# Patient Record
Sex: Female | Born: 1965 | Race: White | Hispanic: No | Marital: Single | Smoking: Never smoker
Health system: Southern US, Community
[De-identification: ages and names within clinical notes are randomized; demographics above are authoritative.]

## PROBLEM LIST (undated history)

## (undated) DIAGNOSIS — D496 Neoplasm of unspecified behavior of brain: Secondary | ICD-10-CM

---

## 1998-02-04 ENCOUNTER — Inpatient Hospital Stay (HOSPITAL_COMMUNITY): Admission: EM | Admit: 1998-02-04 | Discharge: 1998-02-06 | Payer: Self-pay | Admitting: Emergency Medicine

## 1998-06-12 ENCOUNTER — Ambulatory Visit (HOSPITAL_COMMUNITY): Admission: RE | Admit: 1998-06-12 | Discharge: 1998-06-12 | Payer: Self-pay

## 1998-06-18 ENCOUNTER — Emergency Department (HOSPITAL_COMMUNITY): Admission: EM | Admit: 1998-06-18 | Discharge: 1998-06-18 | Payer: Self-pay | Admitting: Emergency Medicine

## 1998-06-18 ENCOUNTER — Encounter: Payer: Self-pay | Admitting: Emergency Medicine

## 1998-07-04 ENCOUNTER — Ambulatory Visit (HOSPITAL_COMMUNITY): Admission: RE | Admit: 1998-07-04 | Discharge: 1998-07-04 | Payer: Self-pay

## 1998-10-23 ENCOUNTER — Encounter: Payer: Self-pay | Admitting: Emergency Medicine

## 1998-10-23 ENCOUNTER — Emergency Department (HOSPITAL_COMMUNITY): Admission: EM | Admit: 1998-10-23 | Discharge: 1998-10-24 | Payer: Self-pay | Admitting: Emergency Medicine

## 1998-11-08 ENCOUNTER — Encounter: Payer: Self-pay | Admitting: Internal Medicine

## 1998-11-08 ENCOUNTER — Inpatient Hospital Stay (HOSPITAL_COMMUNITY): Admission: EM | Admit: 1998-11-08 | Discharge: 1998-11-11 | Payer: Self-pay | Admitting: Internal Medicine

## 1999-06-14 ENCOUNTER — Encounter: Payer: Self-pay | Admitting: Emergency Medicine

## 1999-06-14 ENCOUNTER — Emergency Department (HOSPITAL_COMMUNITY): Admission: EM | Admit: 1999-06-14 | Discharge: 1999-06-14 | Payer: Self-pay | Admitting: Emergency Medicine

## 1999-07-08 ENCOUNTER — Encounter: Payer: Self-pay | Admitting: Orthopedic Surgery

## 1999-07-09 ENCOUNTER — Observation Stay (HOSPITAL_COMMUNITY): Admission: RE | Admit: 1999-07-09 | Discharge: 1999-07-10 | Payer: Self-pay | Admitting: Orthopedic Surgery

## 1999-07-09 ENCOUNTER — Encounter: Payer: Self-pay | Admitting: Orthopedic Surgery

## 1999-11-13 ENCOUNTER — Ambulatory Visit (HOSPITAL_COMMUNITY): Admission: RE | Admit: 1999-11-13 | Discharge: 1999-11-13 | Payer: Self-pay | Admitting: Orthopedic Surgery

## 2005-09-17 ENCOUNTER — Ambulatory Visit: Payer: Self-pay

## 2009-09-07 ENCOUNTER — Ambulatory Visit: Payer: Self-pay

## 2009-11-26 ENCOUNTER — Emergency Department: Payer: Self-pay | Admitting: Emergency Medicine

## 2010-01-15 ENCOUNTER — Ambulatory Visit: Payer: Self-pay

## 2010-02-05 ENCOUNTER — Ambulatory Visit: Payer: Self-pay

## 2010-02-13 ENCOUNTER — Ambulatory Visit: Payer: Self-pay

## 2010-08-08 ENCOUNTER — Ambulatory Visit: Payer: Self-pay

## 2011-02-04 ENCOUNTER — Ambulatory Visit: Payer: Self-pay

## 2018-11-30 ENCOUNTER — Emergency Department
Admission: EM | Admit: 2018-11-30 | Discharge: 2018-12-01 | Disposition: A | Discharge status: Other Acute Inpatient Hospital | Payer: Medicaid Other | Attending: Emergency Medicine | Admitting: Emergency Medicine

## 2018-11-30 DIAGNOSIS — R569 Unspecified convulsions: Secondary | ICD-10-CM | POA: Diagnosis not present

## 2018-11-30 DIAGNOSIS — R531 Weakness: Secondary | ICD-10-CM | POA: Diagnosis present

## 2018-11-30 DIAGNOSIS — D330 Benign neoplasm of brain, supratentorial: Secondary | ICD-10-CM | POA: Diagnosis not present

## 2018-11-30 HISTORY — DX: Neoplasm of unspecified behavior of brain: D49.6

## 2018-11-30 NOTE — ED Triage Notes (Signed)
Pt in via EMS from home d/t L leg numbness; BG 140 ; VSS; BP 149/83; 97% RA; was sitting at home for hours and when decided to get up couldn't; temp 98.6; HR 63 per EMS. Per brother, pt was AMS at first. With EMS, pt A&Ox4.

## 2018-12-01 ENCOUNTER — Other Ambulatory Visit: Payer: Self-pay

## 2018-12-01 ENCOUNTER — Emergency Department: Payer: Medicaid Other

## 2018-12-01 LAB — CBC
HCT: 44.9 % (ref 36.0–46.0)
Hemoglobin: 15.3 g/dL — ABNORMAL HIGH (ref 12.0–15.0)
MCH: 31.9 pg (ref 26.0–34.0)
MCHC: 34.1 g/dL (ref 30.0–36.0)
MCV: 93.5 fL (ref 80.0–100.0)
Platelets: 232 10*3/uL (ref 150–400)
RBC: 4.8 MIL/uL (ref 3.87–5.11)
RDW: 13.4 % (ref 11.5–15.5)
WBC: 7.1 10*3/uL (ref 4.0–10.5)
nRBC: 0 % (ref 0.0–0.2)

## 2018-12-01 LAB — COMPREHENSIVE METABOLIC PANEL
ALT: 27 U/L (ref 0–44)
AST: 21 U/L (ref 15–41)
Albumin: 4 g/dL (ref 3.5–5.0)
Alkaline Phosphatase: 58 U/L (ref 38–126)
Anion gap: 9 (ref 5–15)
BUN: 22 mg/dL — ABNORMAL HIGH (ref 6–20)
CO2: 21 mmol/L — ABNORMAL LOW (ref 22–32)
Calcium: 9.3 mg/dL (ref 8.9–10.3)
Chloride: 111 mmol/L (ref 98–111)
Creatinine, Ser: 0.98 mg/dL (ref 0.44–1.00)
GFR calc Af Amer: >60 mL/min (ref >60)
GFR calc non Af Amer: >60 mL/min (ref >60)
Glucose, Bld: 132 mg/dL — ABNORMAL HIGH (ref 70–99)
Potassium: 3.4 mmol/L — ABNORMAL LOW (ref 3.5–5.1)
Sodium: 141 mmol/L (ref 135–145)
Total Bilirubin: 0.8 mg/dL (ref 0.3–1.2)
Total Protein: 8.4 g/dL — ABNORMAL HIGH (ref 6.5–8.1)

## 2018-12-01 LAB — URINALYSIS, COMPLETE (UACMP) WITH MICROSCOPIC
Bilirubin Urine: NEGATIVE
Glucose, UA: NEGATIVE mg/dL
Hgb urine dipstick: NEGATIVE
Ketones, ur: NEGATIVE mg/dL
Leukocytes,Ua: NEGATIVE
Nitrite: NEGATIVE
Protein, ur: NEGATIVE mg/dL
Specific Gravity, Urine: 1.039 — ABNORMAL HIGH (ref 1.005–1.030)
pH: 5 (ref 5.0–8.0)

## 2018-12-01 LAB — GLUCOSE, CAPILLARY: Glucose-Capillary: 128 mg/dL — ABNORMAL HIGH (ref 70–99)

## 2018-12-01 MED ORDER — GADOBUTROL 1 MMOL/ML IV SOLN
10.0000 mL | Freq: Once | INTRAVENOUS | Status: AC | PRN
  Administered 2018-12-01: 10 mL via INTRAVENOUS

## 2018-12-01 MED ORDER — LEVETIRACETAM IN NACL 1000 MG/100ML IV SOLN
1000.0000 mg | Freq: Once | INTRAVENOUS | Status: AC
  Administered 2018-12-01: 1000 mg via INTRAVENOUS
  Filled 2018-12-01: qty 100

## 2018-12-01 MED ORDER — DEXAMETHASONE SODIUM PHOSPHATE 10 MG/ML IJ SOLN
10.0000 mg | Freq: Once | INTRAMUSCULAR | Status: AC
  Administered 2018-12-01: 07:00:00 10 mg via INTRAVENOUS
  Filled 2018-12-01: qty 1

## 2018-12-01 NOTE — ED Notes (Addendum)
Pt assisted to bedside toilet and then back to bed. Urinated. Repositioned in bed. Denies any other needs. Lights dimmed.

## 2018-12-01 NOTE — ED Notes (Signed)
In room with Dr Owens Shark. Pt understands transfer to [facility] is the best option and agrees. Pt up and ambulated in room with Dr Owens Shark at bedside

## 2018-12-01 NOTE — ED Notes (Signed)
EKG completed. EDP at bedside completing [facility].

## 2018-12-01 NOTE — ED Notes (Signed)
Pt standing at bedside. Difficult for her to follow directions to get back in bed. Unsteady when turning

## 2018-12-01 NOTE — ED Notes (Signed)
Pt's brother Ronalee Belts called wanting update on pt. Pt stated she wanted to speak with brother. Pt given the phone.

## 2018-12-01 NOTE — ED Notes (Signed)
Pt assisted to bedside toilet as she refused bedpan and stated she needed to use the restroom urgently. Pt was tapping both legs against bed. Bed moved next to toilet and pt assisted safely to/from. Urine sample collected.

## 2018-12-01 NOTE — ED Notes (Signed)
Patient transported to MRI 

## 2018-12-01 NOTE — ED Notes (Signed)
Brother Ronalee Belts [phone] will pick up pt when need. States it will take him about 43min. He is aware she is going to MRI and it will likely be a couple hours

## 2018-12-01 NOTE — ED Provider Notes (Signed)
[facility] Emergency Department Provider Note    First MD Initiated Contact with Patient 12/01/18 0000     (approximate)  I have reviewed the triage vital signs and the nursing notes.   HISTORY  Chief Complaint Weakness   HPI Kimberly Stanton is a 53 y.o. 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  Per EMS the patient was sitting on her porch for approximately 4-1/2 hours and was unable to stand.  EMS states that the patient's brother states that this is unusual for the patient.  During evaluation the patient had episodes where she appeared to be non-cognizant to the environment or the question in a manner that was possibly consistent with an absence seizure/partial complex seizure.        Past Medical History:  Diagnosis Date   Brain tumor [facility])    surgically removed per pt    There are no active problems to display for this patient.   History reviewed. No pertinent surgical history.  Prior to Admission medications   Not on File    Allergies Patient has no allergy information on record.  History reviewed. No pertinent family history.  Social History Social History   Tobacco Use   Smoking status: Never Smoker   Smokeless tobacco: Never Used  Substance Use Topics   Alcohol use: Not Currently   Drug use: Yes    Types: Marijuana    Review of Systems Constitutional: No fever/chills Eyes: No visual changes. ENT: No sore throat. Cardiovascular: Denies chest pain. Respiratory: Denies shortness of breath. Gastrointestinal: No abdominal pain.  No nausea, no vomiting.  No diarrhea.  No constipation. Genitourinary: Negative for dysuria. Musculoskeletal: Negative for neck pain.  Negative for back pain. Integumentary: Negative for rash. Neurological: Negative  for headaches, focal weakness or numbness.  Positive for lower extremity weakness.  ____________________________________________   PHYSICAL EXAM:  VITAL SIGNS: ED Triage Vitals  Enc Vitals Group     BP 12/01/18 0002 138/78     Pulse Rate 12/01/18 0005 67     Resp 12/01/18 0005 20     Temp 11/30/18 2358 98.7 F (37.1 C)     Temp Source 11/30/18 2358 Oral     SpO2 12/01/18 0005 100 %     Weight 12/01/18 0010 136.1 kg (300 lb)     Height 12/01/18 0010 1.702 m (5\' 7" )     Head Circumference --      Peak Flow --      Pain Score 12/01/18 0010 0     Pain Loc --      Pain Edu? --      Excl. in [place]? --     Constitutional: Alert and oriented. Well appearing and in no acute distress. Eyes: Conjunctivae are normal. PERRL. EOMI. Head: Atraumatic. Mouth/Throat: Mucous membranes are moist. Oropharynx non-erythematous. Neck: No stridor.   Cardiovascular: Normal rate, regular rhythm. Good peripheral circulation. Grossly normal heart sounds. Respiratory: Normal respiratory effort.  No retractions. Lungs CTAB. Gastrointestinal: Soft and nontender. No distention.  Musculoskeletal: No lower extremity tenderness nor edema. No gross deformities of extremities. Neurologic:  Normal speech and language. No gross focal neurologic deficits are appreciated.  Abstinence seizure-like activity during evaluation. Skin:  Skin is warm, dry and intact. No rash noted. Psychiatric: Mood and affect are normal. Speech and behavior are normal.  ____________________________________________  LABS (all labs ordered are listed, but only abnormal results are displayed)  Labs Reviewed  CBC - Abnormal; Notable for the following components:      Result Value   Hemoglobin 15.3 (*)    All other components within normal limits  COMPREHENSIVE METABOLIC PANEL - Abnormal; Notable for the following components:   Potassium 3.4 (*)    CO2 21 (*)    Glucose, Bld 132 (*)    BUN 22 (*)    Total Protein 8.4 (*)    All  other components within normal limits  URINALYSIS, COMPLETE (UACMP) WITH MICROSCOPIC - Abnormal; Notable for the following components:   Color, Urine AMBER (*)    APPearance HAZY (*)    Specific Gravity, Urine 1.039 (*)    Bacteria, UA RARE (*)    All other components within normal limits  GLUCOSE, CAPILLARY - Abnormal; Notable for the following components:   Glucose-Capillary 128 (*)    All other components within normal limits   ____________________________________________  EKG  ED ECG REPORT I, [place] N Britni Driscoll, the attending physician, personally viewed and interpreted this ECG.   Date: 12/01/2018  EKG Time: 12:06 AM  Rate: 61  Rhythm: Normal sinus rhythm  Axis: Normal  Intervals: Normal  ST&T Change: None  ____________________________________________  RADIOLOGY I, [place] N Tj Kitchings, personally viewed and evaluated these images (plain radiographs) as part of my medical decision making, as well as reviewing the written report by the radiologist.  ED MD interpretation: Loss of normal white-gray matter differentiation with suggestion of underlying mass within the adjacent anterior inferior right frontotemporal region concerning for residual or recurrent mass with mass-effect 11 mm right to left shift CT Head.  MRI brain consistent with infiltrating tumor in the right more than left anterior cerebrum with signs of necrosis 6.2 cm area of enhancement 17 mm right to left shift.  Official radiology report(s): Ct Head Wo Contrast  Result Date: 12/01/2018 CLINICAL DATA:  Initial evaluation for acute altered mental status, left leg numbness. 53 year old female with history of history of right frontal astrocytoma, status post resection. EXAM: CT HEAD WITHOUT CONTRAST TECHNIQUE: Contiguous axial images were obtained from the base of the skull through the vertex without intravenous contrast. COMPARISON:  None available. FINDINGS: Brain: Postoperative changes from prior right frontal  craniotomy. Underlying area of cystic encephalomalacia, communicating with the underlying right lateral ventricle. Adjacent to this region, there is ill-defined loss of gray-white matter differentiation involving the anterior/inferior right frontotemporal region, with suggestive of underlying mass lesion and vasogenic edema. Exact measurements difficult given the ill-defined nature of this finding. Localized regional mass effect and edema with up to 11 mm right-to-left shift of the anterior falx. Basilar cisterns remain patent. No hydrocephalus or ventricular trapping. No other appreciable mass lesion. No intracranial hemorrhage or large vessel territory infarct. No extra-axial fluid collection. Vascular: No hyperdense vessel. Skull: Scalp soft tissues within normal limits. Prior right frontal craniotomy. Sinuses/Orbits: Globes and orbital soft tissues within normal limits. Paranasal sinuses are clear. No mastoid effusion. Other: None. IMPRESSION: 1. Sequelae of prior right frontal craniotomy for tumor resection, with associated cystic encephalomalacia within the underlying right frontal lobe. 2. Loss of normal gray-white matter differentiation with suggestion of underlying mass within the adjacent anterior/inferior right frontotemporal region. Finding concerning for residual and/or recurrent mass. 3. Associated mass effect with up to 11 mm right-to-left shift of the anterior falx. Electronically Signed   By: Jeannine Boga M.D.   On: 12/01/2018 01:52   Mr  Brain W And Wo Contrast  Result Date: 12/01/2018 CLINICAL DATA:  Left leg weakness. History of astrocytoma resection 20 years ago EXAM: MRI HEAD WITHOUT AND WITH CONTRAST TECHNIQUE: Multiplanar, multiecho pulse sequences of the brain and surrounding structures were obtained without and with intravenous contrast. CONTRAST:  10 cc Gadavist intravenous COMPARISON:  Head CT from earlier today. Brain MRI report from [place] 02/18/2015 FINDINGS: Brain: Large  infiltrating T2 hyperintense mass centered in the inferior right frontal lobe with bulging of the midline towards the left and extension into the right anterior temporal lobe, insula, deep white matter tracts. Similar signal is seen about the frontal horn of the left lateral ventricle. Superior to this mass is a resection cavity. There is extensive enhancement centered on the inferior right frontal lobe measuring up to 6.2 cm, with areas of patchy non enhancement. Wispy enhancement continues into the anterior right temporal lobe. Patchy nodular enhancement is seen along the left lateral ventricle at the body and frontal horn. No evidence of acute infarct, acute hemorrhage, or entrapment. Midline shift measures up to 17 mm. Vascular: Major flow voids are preserved Skull and upper cervical spine: Remote right frontal craniotomy. No acute or aggressive finding. Sinuses/Orbits: Negative IMPRESSION: Infiltrating tumor in the right more than left anterior cerebrum with signs of necrosis. 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 Midline shift measures 17 mm. Electronically Signed   By: Monte Fantasia M.D.   On: 12/01/2018 05:14    ____________________________________________   PROCEDURES   Procedure(s) performed (including Critical Care):  Procedures   ____________________________________________   INITIAL IMPRESSION / MDM / [place] / ED COURSE  As part of my medical decision making, I reviewed the following data within the electronic MEDICAL RECORD NUMBER   53 year old female presenting with above-stated history and physical exam concerning for lower extremity weakness with possibly of silence versus partial complex seizures.  As such CT scan of the head performed which revealed possible recurrence of frontotemporal.  MRI of the brain consistent with infiltrative mass with 17 mm of right to left shift.  Patient discussed with Dr. Lacinda Axon neurosurgery who  recommended Decadron and Keppra with transfer the patient to [facility] neuro-oncology.  Patient subsequently discussed with Dr. Peggye Form who stated the patient was better suited to be admitted to neurosurgery.  Patient subsequently discussed with Dr. Lacinda Axon patient was accepted to [facility] neurosurgery by Dr. Leana Gamer was evaluated in Emergency Department on 12/01/2018 for the symptoms described in the history of present illness. She was evaluated in the context of the global COVID-19 pandemic, which necessitated consideration that the patient might be at risk for infection with the SARS-CoV-2 virus that causes COVID-19. Institutional protocols and algorithms that pertain to the evaluation of patients at risk for COVID-19 are in a state of rapid change based on information released by regulatory bodies including the CDC and federal and state organizations. These policies and algorithms were followed during the patient's care in the ED.        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   Gregor Hams, MD 12/01/18 [phone]

## 2018-12-01 NOTE — ED Notes (Signed)
Pt repositioned in bed; given pillow, given warm blankets.

## 2018-12-01 NOTE — ED Notes (Signed)
Report called to [facility] ED Charolette Forward RN. Medical Necessity and RN portion of EMTALA completed

## 2018-12-01 NOTE — ED Notes (Signed)
EMTALA reviewed. 

## 2018-12-01 NOTE — ED Notes (Signed)
Brother given brief update after talking with pt. EDP will talk with brother Ronalee Belts further once here.

## 2018-12-01 NOTE — ED Notes (Signed)
Pt leaving for CT.  

## 2018-12-01 NOTE — ED Notes (Signed)
Pt resting in bed, no c/o pain, no concerns, bed locked and in lowest position, call bell in reach

## 2018-12-01 NOTE — ED Notes (Signed)
Pt back from CT

## 2020-11-30 IMAGING — CT CT HEAD WITHOUT CONTRAST
3 series · 14 of 47 positions shown, 16 images · non-contrast
Comparison: None available.

CLINICAL DATA: Initial evaluation for acute altered mental status,
left leg numbness. 52-year-old female with history of history of
right frontal astrocytoma, status post resection.

EXAM:
CT HEAD WITHOUT CONTRAST
TECHNIQUE: Contiguous axial images were obtained from the base of the skull
through the vertex without intravenous contrast.

[Series 3: head wo · axial · 0.40mm/px · z∈[-161,-36]mm · 8 of 30 slices shown, 10 images]
[im 3/30  brain]
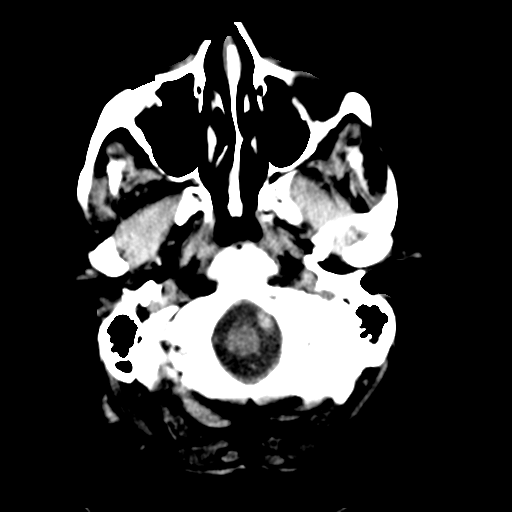
[im 3/30  bone]
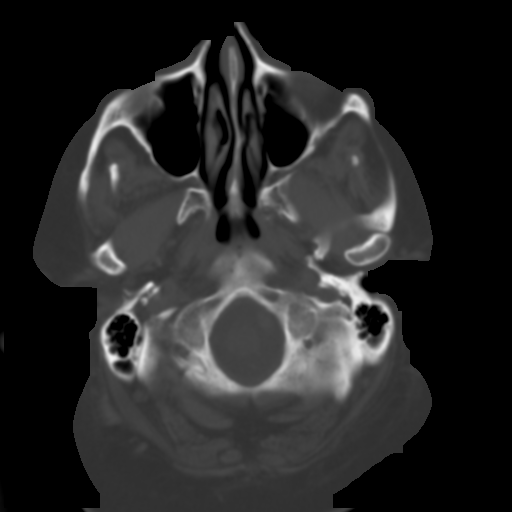
[im 7/30  brain]
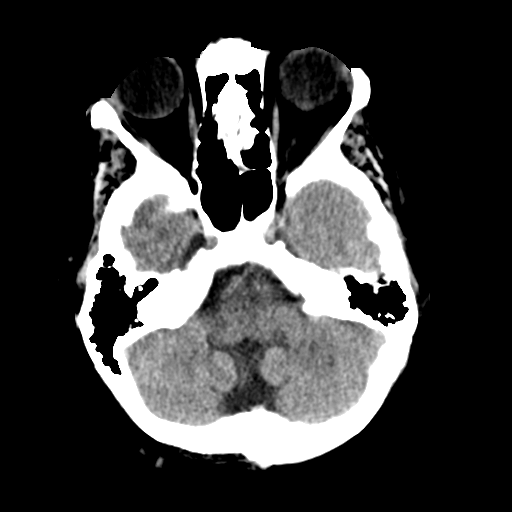
[im 10/30  brain]
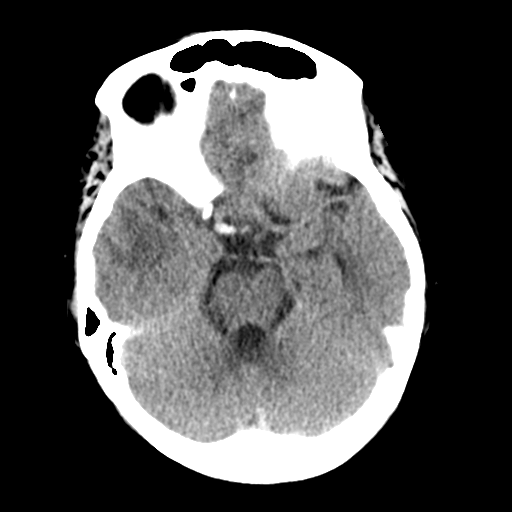
[im 14/30  brain]
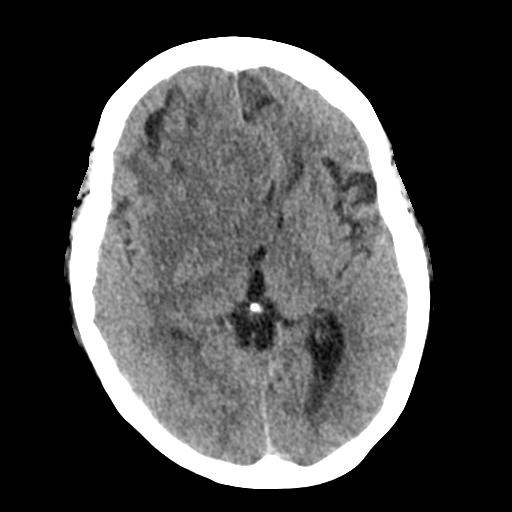
[im 17/30  brain]
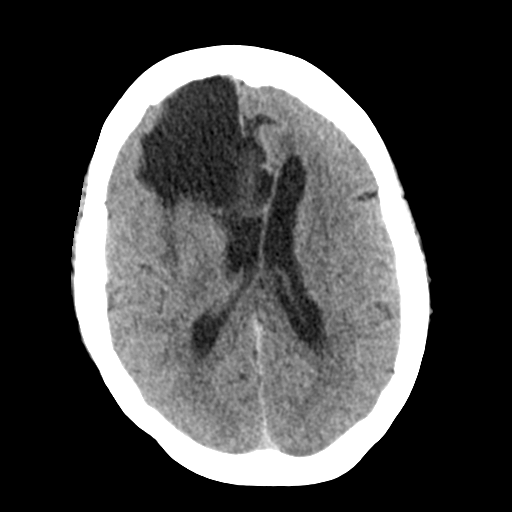
[im 17/30  bone]
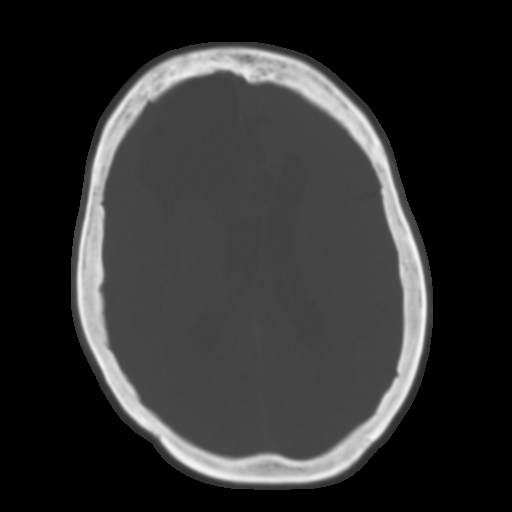
[im 21/30  brain]
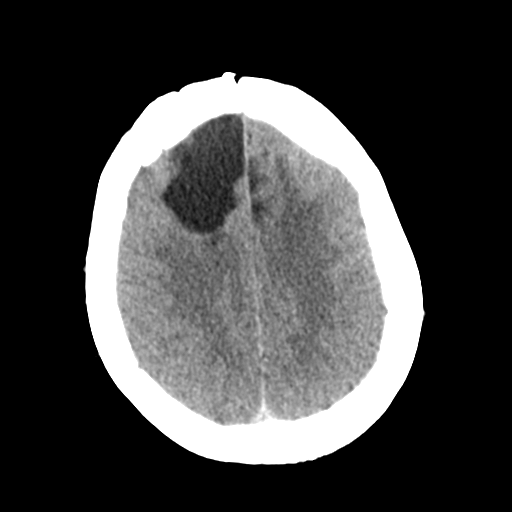
[im 24/30  brain]
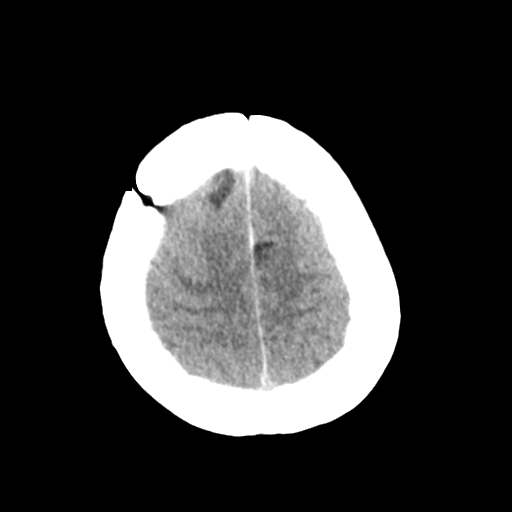
[im 28/30  brain]
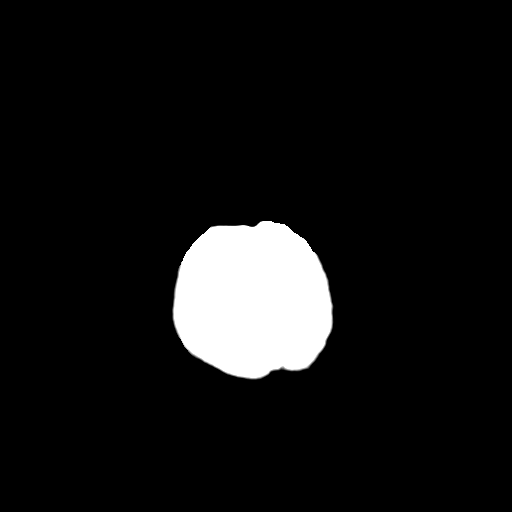

[Series 4: coronal soft tissue · coronal · 0.29mm/px · 3 of 64 slices shown]
[im 22/64  brain]
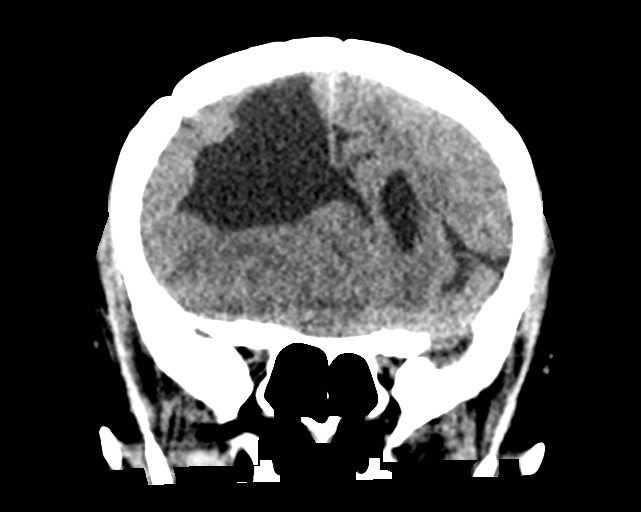
[im 29/64  brain]
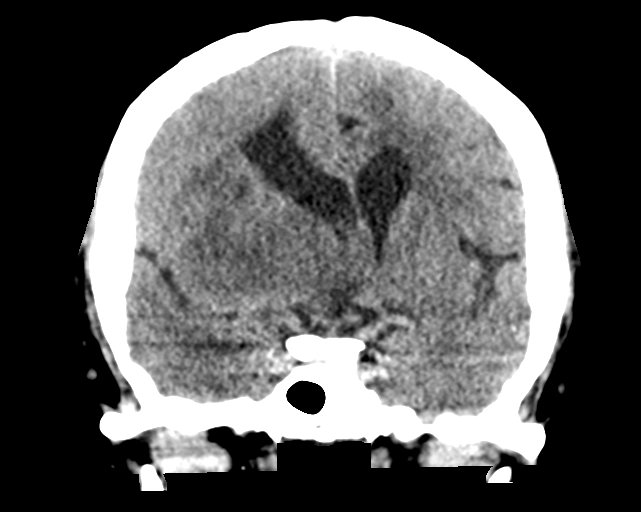
[im 36/64  brain]
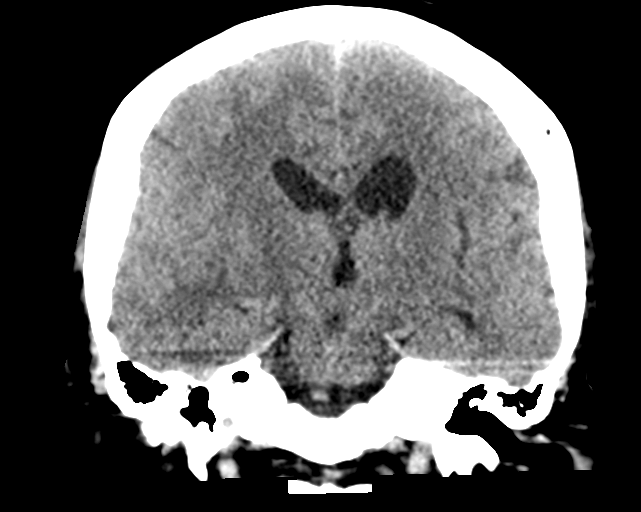

[Series 5: sagittal soft tissue · sagittal · 0.29mm/px · 3 of 58 slices shown]
[im 20/58  brain]
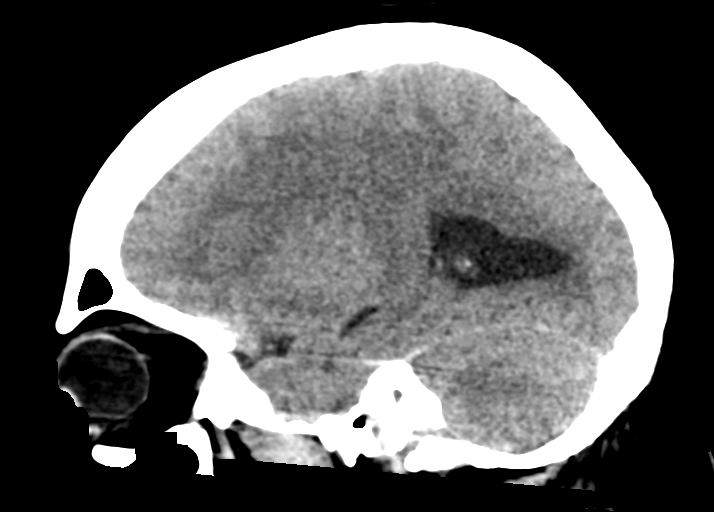
[im 29/58  brain]
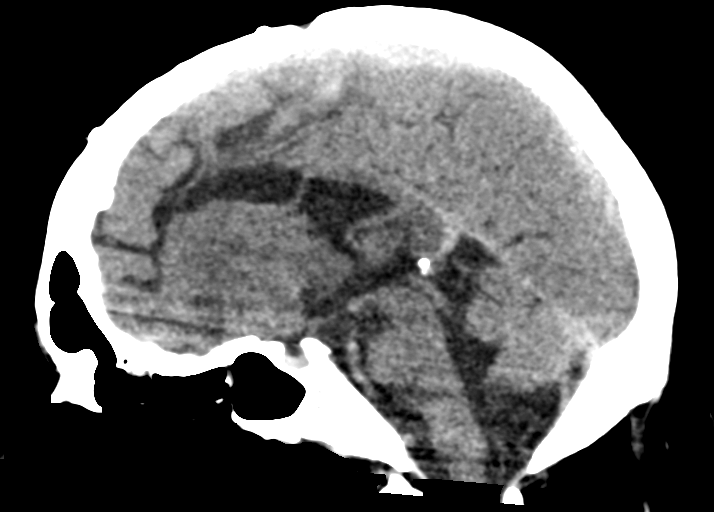
[im 39/58  brain]
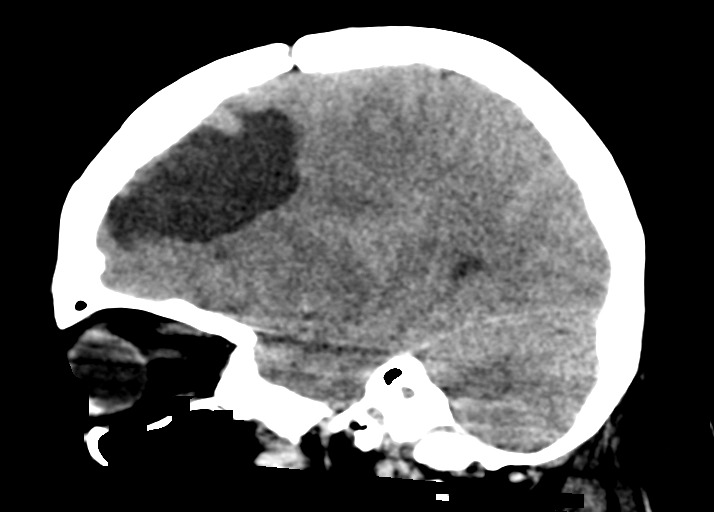

[14 of 47 positions shown; findings below may reference images not displayed]

FINDINGS: Brain: Postoperative changes from prior right frontal craniotomy.
Underlying area of cystic encephalomalacia, communicating with the
underlying right lateral ventricle. Adjacent to this region, there
is ill-defined loss of gray-white matter differentiation involving
the anterior/inferior right frontotemporal region, with suggestive
of underlying mass lesion and vasogenic edema. Exact measurements
difficult given the ill-defined nature of this finding. Localized
regional mass effect and edema with up to 11 mm right-to-left shift
of the anterior falx. Basilar cisterns remain patent. No
hydrocephalus or ventricular trapping.

No other appreciable mass lesion. No intracranial hemorrhage or
large vessel territory infarct. No extra-axial fluid collection.

Vascular: No hyperdense vessel.

Skull: Scalp soft tissues within normal limits. Prior right frontal
craniotomy.

Sinuses/Orbits: Globes and orbital soft tissues within normal
limits. Paranasal sinuses are clear. No mastoid effusion.

Other: None.
IMPRESSION: 1. Sequelae of prior right frontal craniotomy for tumor resection,
with associated cystic encephalomalacia within the underlying right
frontal lobe.
2. Loss of normal gray-white matter differentiation with suggestion
of underlying mass within the adjacent anterior/inferior right
frontotemporal region. Finding concerning for residual and/or
recurrent mass.
3. Associated mass effect with up to 11 mm right-to-left shift of
the anterior falx.
